# Patient Record
Sex: Male | Born: 1975 | Race: White | Hispanic: No | Marital: Married | Smoking: Current every day smoker
Health system: Southern US, Community
[De-identification: ages and names within clinical notes are randomized; demographics above are authoritative.]

---

## 2006-11-21 ENCOUNTER — Emergency Department (HOSPITAL_COMMUNITY): Admission: EM | Admit: 2006-11-21 | Discharge: 2006-11-21 | Payer: Self-pay | Admitting: Emergency Medicine

## 2007-04-22 ENCOUNTER — Emergency Department (HOSPITAL_COMMUNITY): Admission: EM | Admit: 2007-04-22 | Discharge: 2007-04-22 | Payer: Self-pay | Admitting: Emergency Medicine

## 2009-04-03 ENCOUNTER — Emergency Department (HOSPITAL_COMMUNITY): Admission: EM | Admit: 2009-04-03 | Discharge: 2009-04-03 | Payer: Self-pay | Admitting: Emergency Medicine

## 2010-11-03 IMAGING — CR DG LUMBAR SPINE COMPLETE 4+V
5 series · 5 of 5 positions shown · non-contrast
Comparison: None.

CLINICAL DATA: Fall on 03/30/2009.  Compression fractures L1-L2.

LUMBAR SPINE - COMPLETE 4+ VIEW

[t l-spine a.p.]
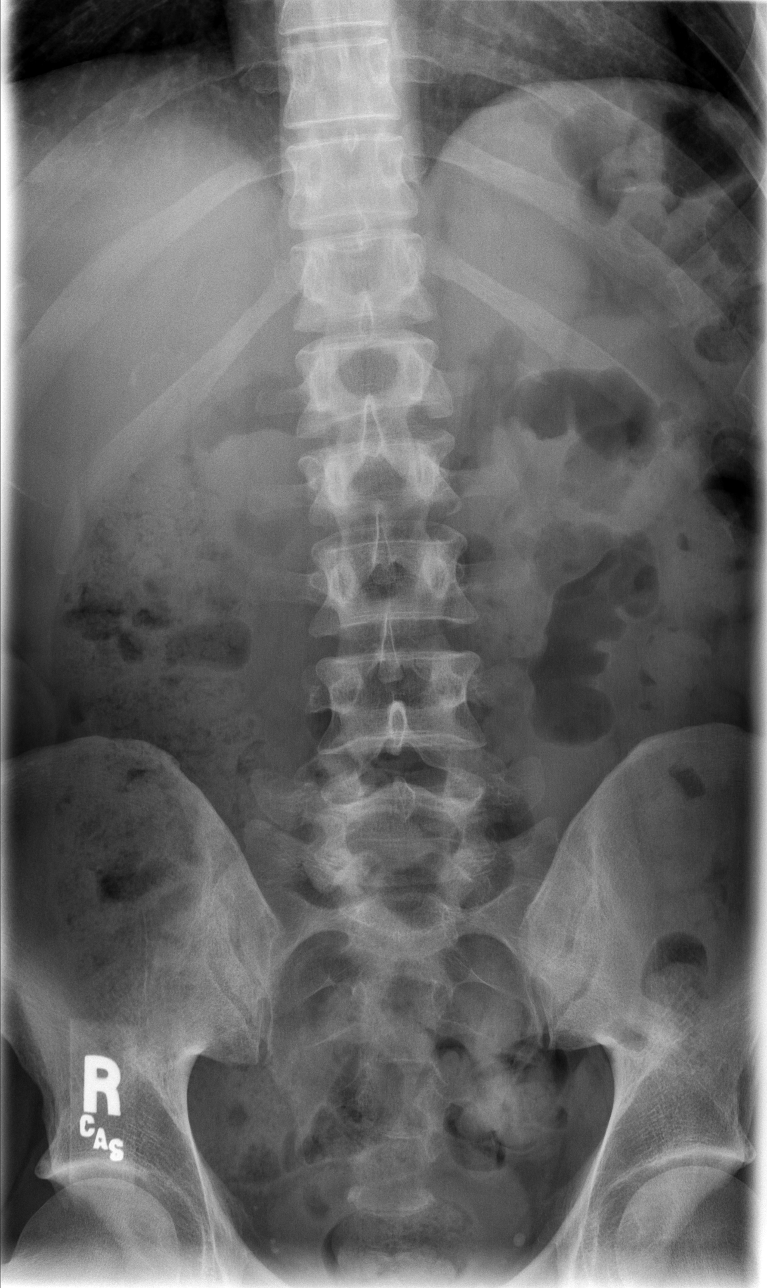

[t l-spine oblique exposure (1 of 2)]
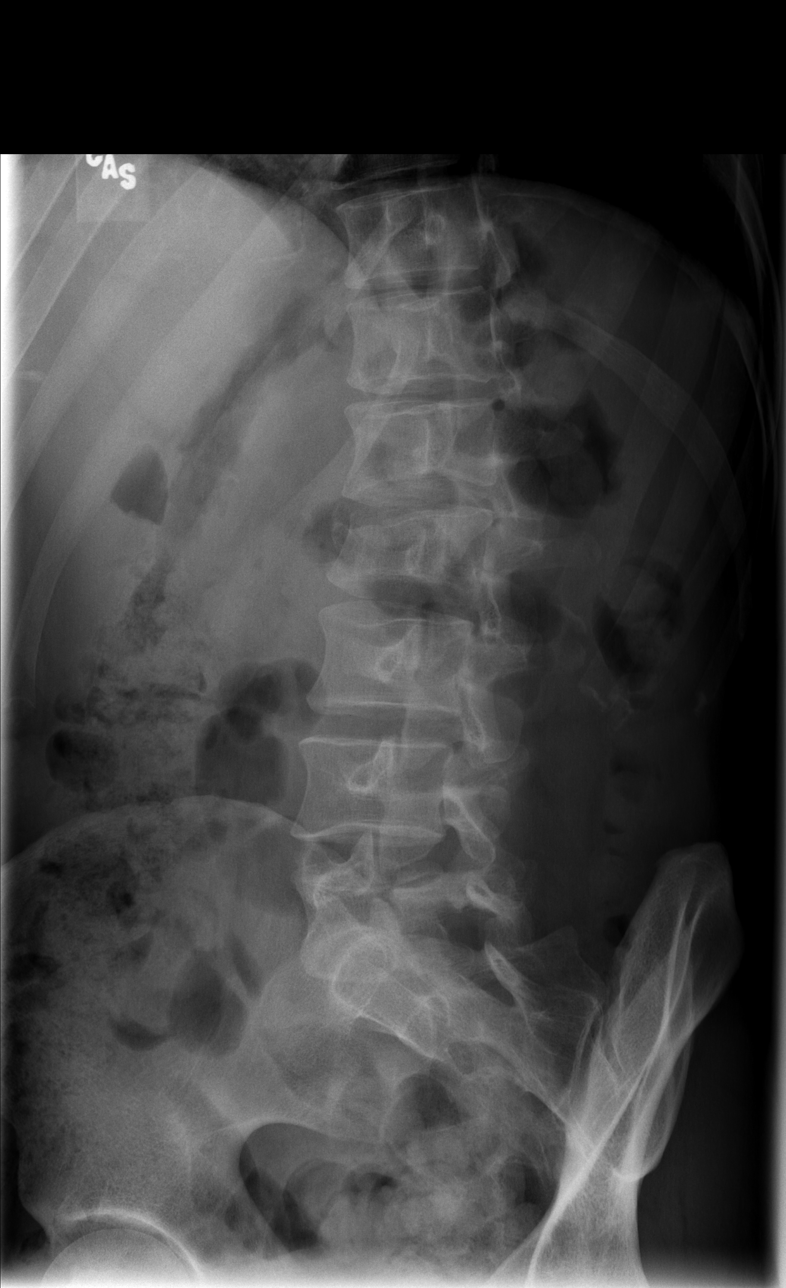

[t l-spine oblique exposure (2 of 2)]
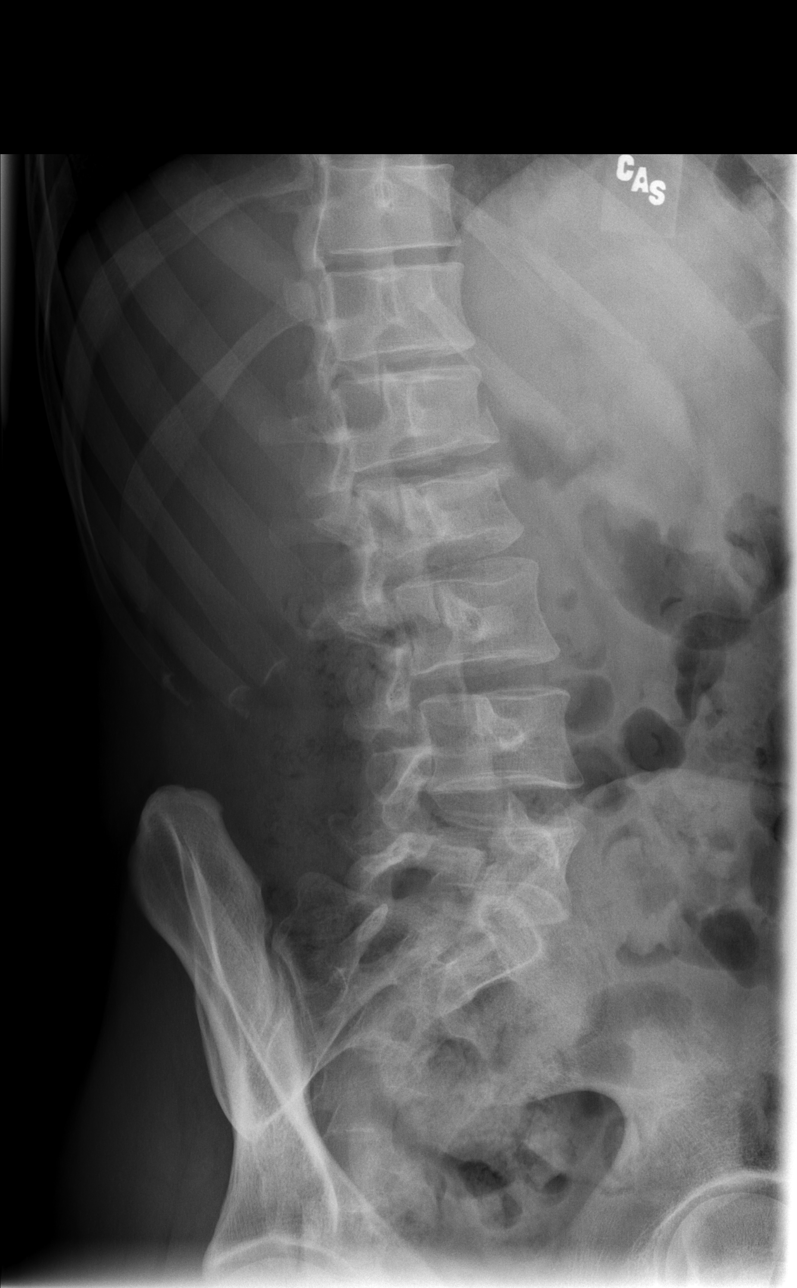

[t l-spine lat]
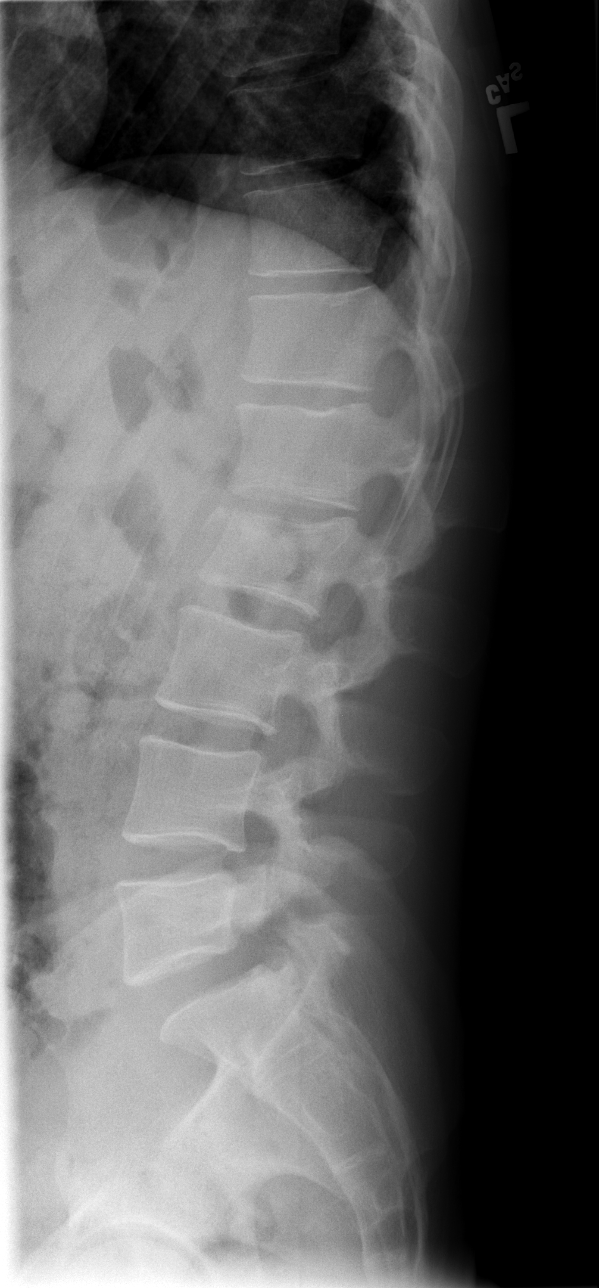

[t l-spine l5-s1 spot]
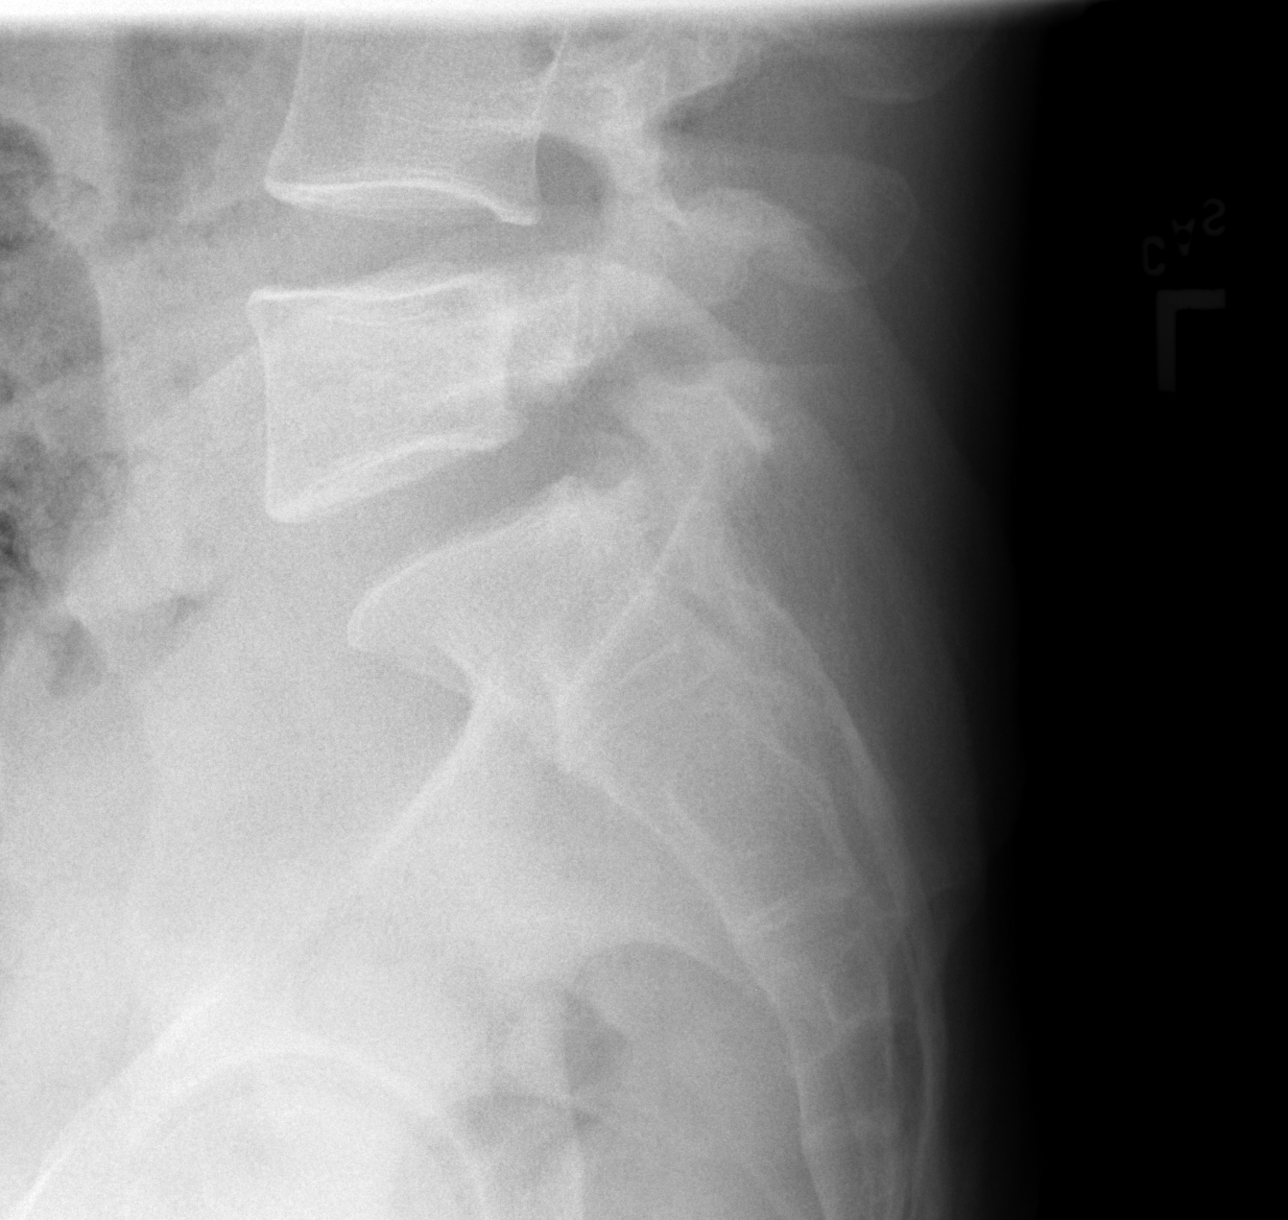

[5 of 5 positions shown; findings below may reference images not displayed]

FINDINGS: There is compression fracture of the L1-L2 vertebral body
with approximately 20% loss vertebral body height.  No evidence of
subluxation.  There is a focal kyphosis at L1-L2.  There are pars
defects on the left and right at L5 with 3 mm of anterolisthesis of
L5 on S1.
IMPRESSION: 1.  Compression fractures of L1-L2.  No comparison available.
2.  Bilateral pars defects at L5 with grade 1 spondylolisthesis.

## 2016-02-23 ENCOUNTER — Emergency Department (HOSPITAL_COMMUNITY)
Admission: EM | Admit: 2016-02-23 | Discharge: 2016-02-23 | Disposition: A | Discharge status: Home / Self Care | Payer: Self-pay | Attending: Emergency Medicine | Admitting: Emergency Medicine

## 2016-02-23 ENCOUNTER — Encounter (HOSPITAL_COMMUNITY): Payer: Self-pay | Admitting: Emergency Medicine

## 2016-02-23 DIAGNOSIS — F17210 Nicotine dependence, cigarettes, uncomplicated: Secondary | ICD-10-CM | POA: Insufficient documentation

## 2016-02-23 DIAGNOSIS — Y9389 Activity, other specified: Secondary | ICD-10-CM | POA: Insufficient documentation

## 2016-02-23 DIAGNOSIS — S0181XA Laceration without foreign body of other part of head, initial encounter: Secondary | ICD-10-CM

## 2016-02-23 DIAGNOSIS — Y929 Unspecified place or not applicable: Secondary | ICD-10-CM | POA: Insufficient documentation

## 2016-02-23 DIAGNOSIS — W268XXA Contact with other sharp object(s), not elsewhere classified, initial encounter: Secondary | ICD-10-CM | POA: Insufficient documentation

## 2016-02-23 DIAGNOSIS — Y999 Unspecified external cause status: Secondary | ICD-10-CM | POA: Insufficient documentation

## 2016-02-23 DIAGNOSIS — S1191XA Laceration without foreign body of unspecified part of neck, initial encounter: Secondary | ICD-10-CM | POA: Insufficient documentation

## 2016-02-23 MED ORDER — HYDROCODONE-ACETAMINOPHEN 5-325 MG PO TABS: 1.0000 | ORAL_TABLET | Freq: Four times a day (QID) | ORAL | 0 refills | Status: AC | PRN

## 2016-02-23 MED ORDER — POVIDONE-IODINE 10 % EX SOLN
CUTANEOUS | Status: AC
  Filled 2016-02-23: qty 118

## 2016-02-23 MED ORDER — LIDOCAINE HCL (PF) 2 % IJ SOLN
INTRAMUSCULAR | Status: AC
  Filled 2016-02-23: qty 10

## 2016-02-23 MED ORDER — LIDOCAINE VISCOUS 2 % MT SOLN: 15.0000 mL | Freq: Once | OROMUCOSAL | Status: DC

## 2016-02-23 MED ORDER — CEPHALEXIN 500 MG PO CAPS: 500.0000 mg | ORAL_CAPSULE | Freq: Four times a day (QID) | ORAL | 0 refills | Status: AC

## 2016-02-23 MED ORDER — BACITRACIN ZINC 500 UNIT/GM EX OINT
TOPICAL_OINTMENT | Freq: Once | CUTANEOUS | Status: AC
  Administered 2016-02-23: 1 via TOPICAL
  Filled 2016-02-23: qty 0.9

## 2016-02-23 MED ORDER — LIDOCAINE HCL (PF) 2 % IJ SOLN
10.0000 mL | Freq: Once | INTRAMUSCULAR | Status: AC
  Administered 2016-02-23: 18:00:00

## 2016-02-23 NOTE — Discharge Instructions (Signed)
Local wound care with bacitracin and dressing changes twice daily.  Keep the wound clean, dry, and covered.  Sutures are to be removed in the next 7-10 days. Return sooner if you experience increased pain, redness, streaking extending from the wound, or pus draining from the wound. These are signs of potential infection.

## 2016-02-23 NOTE — ED Triage Notes (Signed)
PT states he was using an angle grinder today and it kicked and cut under his chin on the left upper side of his neck. Bleeding controlled at this time with deep laceration noted and bone visible.

## 2016-02-23 NOTE — ED Provider Notes (Signed)
[facility] DEPT Provider Note   CSN: [id number] Arrival date & time: 02/23/16  1729     History   Chief Complaint Chief Complaint  Patient presents with  . Neck Injury  . Laceration    HPI Craig Hodges is a 41 y.o. male.  Patient is a 41 year old male who presents with complaints of neck laceration. He was operating a grinding wheel when it kicked back and struck him under the chin. Bleeding controlled with direct pressure. No difficulty breathing or swallowing.   The history is provided by the patient.  Neck Injury  This is a new problem. The current episode started less than 1 hour ago. The problem occurs constantly. The problem has not changed since onset.Nothing aggravates the symptoms. Nothing relieves the symptoms. He has tried nothing for the symptoms. The treatment provided no relief.  Laceration      History reviewed. No pertinent past medical history.  There are no active problems to display for this patient.   History reviewed. No pertinent surgical history.     Home Medications    Prior to Admission medications   Not on File    Family History History reviewed. No pertinent family history.  Social History Social History  Substance Use Topics  . Smoking status: Current Every Day Smoker    Packs/day: 1.50    Types: Cigarettes  . Smokeless tobacco: Never Used  . Alcohol use No     Allergies   Patient has no known allergies.   Review of Systems Review of Systems  All other systems reviewed and are negative.    Physical Exam Updated Vital Signs BP 117/90 (BP Location: Right Arm)   Pulse 81   Temp 98.2 F (36.8 C) (Oral)   Resp 18   Ht 5\' 10"  (1.778 m)   Wt 135 lb (61.2 kg)   SpO2 97%   BMI 19.37 kg/m   Physical Exam  Constitutional: He is oriented to person, place, and time. He appears well-developed and well-nourished.  HENT:  Head: Normocephalic and atraumatic.  Neck: Normal range of motion. Neck supple.  There  is a 3.5 cm laceration just under the left side of the chin. The depths of the wound are visible and there is no involvement of bone or other structures. There is some debris present from the grinding wheel.  Neurological: He is alert and oriented to person, place, and time.  Skin: Skin is warm and dry. He is not diaphoretic.  Nursing note and vitals reviewed.    ED Treatments / Results  Labs (all labs ordered are listed, but only abnormal results are displayed) Labs Reviewed - No data to display  EKG  EKG Interpretation None       Radiology No results found.  Procedures Procedures (including critical care time)  Medications Ordered in ED Medications  lidocaine (XYLOCAINE) 2 % viscous mouth solution 15 mL (not administered)  lidocaine (XYLOCAINE) 2 % injection (not administered)  povidone-iodine (BETADINE) 10 % external solution (not administered)     Initial Impression / Assessment and Plan / ED Course  I have reviewed the triage vital signs and the nursing notes.  Pertinent labs & imaging results that were available during my care of the patient were reviewed by me and considered in my medical decision making (see chart for details).  Laceration repaired as below. As stated in the physical examination, there are no concerning findings with this laceration that would suggest nerve, blood vessel, or bony  injury to any of the structures of the neck. The laceration was closed and he tolerated this procedure well.  LACERATION REPAIR Performed by: Geoffery LyonseLo, Brnadon Eoff Authorized by: Geoffery LyonseLo, Keundra Petrucelli Consent: Verbal consent obtained. Risks and benefits: risks, benefits and alternatives were discussed Consent given by: patient Patient identity confirmed: provided demographic data Prepped and Draped in normal sterile fashion Wound explored  Laceration Location: chin/neck  Laceration Length: 3.5cm  No Foreign Bodies seen or palpated  Anesthesia: local infiltration  Local  anesthetic: lidocaine 1% without epinephrine  Anesthetic total: 8 ml  Irrigation method: syringe Amount of cleaning: standard  Skin closure: 4-0 Prolene  Number of sutures: 8  Technique: simple interrupted.  Patient tolerance: Patient tolerated the procedure well with no immediate complications.   He will be treated with Keflex, local wound care, pain medication, and suture removal in 7-10 days.   Final Clinical Impressions(s) / ED Diagnoses   Final diagnoses:  None    New Prescriptions New Prescriptions   No medications on file     Geoffery Lyonsouglas Sol Odor, MD 02/23/16 1831
